# Patient Record
Sex: Female | Born: 2005 | Race: White | Hispanic: No | Marital: Single
Health system: Southern US, Community
[De-identification: ages and names within clinical notes are randomized; demographics above are authoritative.]

## PROBLEM LIST (undated history)

## (undated) DIAGNOSIS — J45909 Unspecified asthma, uncomplicated: Secondary | ICD-10-CM

## (undated) DIAGNOSIS — L309 Dermatitis, unspecified: Secondary | ICD-10-CM

## (undated) DIAGNOSIS — L509 Urticaria, unspecified: Secondary | ICD-10-CM

## (undated) HISTORY — DX: Dermatitis, unspecified: L30.9

## (undated) HISTORY — DX: Urticaria, unspecified: L50.9

## (undated) HISTORY — PX: TONSILLECTOMY: SUR1361

## (undated) HISTORY — PX: ADENOIDECTOMY: SUR15

## (undated) HISTORY — PX: TYMPANOSTOMY TUBE PLACEMENT: SHX32

## (undated) HISTORY — DX: Unspecified asthma, uncomplicated: J45.909

---
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
## 2023-03-07 NOTE — Progress Notes (Unsigned)
New Patient Note  RE: Sierra Briggs MRN: [id number] DOB: February 24, 2006 Date of Office Visit: 03/08/2023  Consult requested by: Chyrl Civatte, FNP Primary care provider: Rick Duff, PA-C  Chief Complaint: No chief complaint on file.  History of Present Illness: I had the pleasure of seeing Sierra Briggs for initial evaluation at the [facility] on 03/07/2023. She is a 17 y.o. female, who is referred here by Rick Duff, PA-C for the evaluation of asthma.  She reports symptoms of *** chest tightness, shortness of breath, coughing, wheezing, nocturnal awakenings for *** years. Current medications include *** which help. She reports *** using aerochamber with inhalers. She tried the following inhalers: ***. Main triggers are ***allergies, infections, weather changes, smoke, exercise, pet exposure. In the last month, frequency of symptoms: ***x/week. Frequency of nocturnal symptoms: ***x/month. Frequency of SABA use: ***x/week. Interference with physical activity: ***. Sleep is ***disturbed. In the last 12 months, emergency room visits/urgent care visits/doctor office visits or hospitalizations due to respiratory issues: ***. In the last 12 months, oral steroids courses: ***. Lifetime history of hospitalization for respiratory issues: ***. Prior intubations: ***. Asthma was diagnosed at age *** by ***. History of pneumonia: ***. She was evaluated by allergist ***pulmonologist in the past. Smoking exposure: ***. Up to date with flu vaccine: ***. Up to date with pneumonia vaccine: ***. Up to date with COVID-19 vaccine: ***. Prior Covid-19 infection: ***. History of reflux: ***.  Assessment and Plan: [name] is a 17 y.o. female with: ***  No follow-ups on file.  No orders of the defined types were placed in this encounter.  Lab Orders  No laboratory test(s) ordered today    Other allergy screening: Asthma: {Blank single:19197::"yes","no"} Rhino  conjunctivitis: {Blank single:19197::"yes","no"} Food allergy: {Blank single:19197::"yes","no"} Medication allergy: {Blank single:19197::"yes","no"} Hymenoptera allergy: {Blank single:19197::"yes","no"} Urticaria: {Blank single:19197::"yes","no"} Eczema:{Blank single:19197::"yes","no"} History of recurrent infections suggestive of immunodeficency: {Blank single:19197::"yes","no"}  Diagnostics: Spirometry:  Tracings reviewed. Her effort: {Blank single:19197::"Good reproducible efforts.","It was hard to get consistent efforts and there is a question as to whether this reflects a maximal maneuver.","Poor effort, data can not be interpreted."} FVC: ***L FEV1: ***L, ***% predicted FEV1/FVC ratio: ***% Interpretation: {Blank single:19197::"Spirometry consistent with mild obstructive disease","Spirometry consistent with moderate obstructive disease","Spirometry consistent with severe obstructive disease","Spirometry consistent with possible restrictive disease","Spirometry consistent with mixed obstructive and restrictive disease","Spirometry uninterpretable due to technique","Spirometry consistent with normal pattern","No overt abnormalities noted given today's efforts"}.  Please see scanned spirometry results for details.  Skin Testing: {Blank single:19197::"Select foods","Environmental allergy panel","Environmental allergy panel and select foods","Food allergy panel","None","Deferred due to recent antihistamines use"}. *** Results discussed with patient/family.   Past Medical History: Patient Active Problem List   Diagnosis Date Noted  . Bipolar 2 disorder (HCC) 10/02/2019  . Attention deficit hyperactivity disorder (ADHD), combined type 10/02/2019  . Post traumatic stress disorder (PTSD) 10/02/2019   Past Medical History:  Diagnosis Date  . Asthma    Past Surgical History: Past Surgical History:  Procedure Laterality Date  . TONSILLECTOMY     Medication List:  Current Outpatient  Medications  Medication Sig Dispense Refill  . albuterol (PROVENTIL) (2.5 MG/3ML) 0.083% nebulizer solution Take 3 mLs (2.5 mg total) by nebulization every 6 (six) hours as needed for wheezing. 75 mL 12  . busPIRone (BUSPAR) 15 MG tablet Take 1 tablet (15 mg total) by mouth 2 (two) times daily. (Patient not taking: Reported on 12/04/2020) 60 tablet 1  . cloNIDine (CATAPRES) 0.3 MG tablet TAKE  1 TABLET BY MOUTH AT BEDTIME 30 tablet 0  . escitalopram (LEXAPRO) 20 MG tablet Take 1 tablet by mouth once daily 30 tablet 0  . lisdexamfetamine (VYVANSE) 50 MG capsule Take 1 capsule (50 mg total) by mouth daily with breakfast. 30 capsule 0  . lisdexamfetamine (VYVANSE) 50 MG capsule Take 1 capsule (50 mg total) by mouth daily with breakfast. (Patient not taking: Reported on 12/04/2020) 30 capsule 0  . lurasidone (LATUDA) 20 MG TABS tablet Take 1 tablet with dinner 30 tablet 1  . montelukast (SINGULAIR) 5 MG chewable tablet Chew 5 mg by mouth at bedtime.     No current facility-administered medications for this visit.   Allergies: No Known Allergies Social History: Social History   Socioeconomic History  . Marital status: Single    Spouse name: Not on file  . Number of children: Not on file  . Years of education: Not on file  . Highest education level: Not on file  Occupational History  . Not on file  Tobacco Use  . Smoking status: Passive Smoke Exposure - Never Smoker  . Smokeless tobacco: Not on file  Substance and Sexual Activity  . Alcohol use: Not on file  . Drug use: Not on file  . Sexual activity: Not on file  Other Topics Concern  . Not on file  Social History Narrative  . Not on file   Social Determinants of Health   Financial Resource Strain: Not on file  Food Insecurity: Not on file  Transportation Needs: Not on file  Physical Activity: Not on file  Stress: Not on file  Social Connections: Not on file   Lives in a ***. Smoking: *** Occupation: ***  Environmental  HistorySurveyor, minerals in the house: Copywriter, advertising in the family room: {Blank single:19197::"yes","no"} Carpet in the bedroom: {Blank single:19197::"yes","no"} Heating: {Blank single:19197::"electric","gas","heat pump"} Cooling: {Blank single:19197::"central","window","heat pump"} Pet: {Blank single:19197::"yes ***","no"}  Family History: Family History  Problem Relation Age of Onset  . COPD Father   . Diabetes Father    Problem                               Relation Asthma                                   *** Eczema                                *** Food allergy                          *** Allergic rhino conjunctivitis     ***  Review of Systems  Constitutional:  Negative for appetite change, chills, fever and unexpected weight change.  HENT:  Negative for congestion and rhinorrhea.   Eyes:  Negative for itching.  Respiratory:  Negative for cough, chest tightness, shortness of breath and wheezing.   Cardiovascular:  Negative for chest pain.  Gastrointestinal:  Negative for abdominal pain.  Genitourinary:  Negative for difficulty urinating.  Skin:  Negative for rash.  Neurological:  Negative for headaches.   Objective: There were no vitals taken for this visit. There is no height or weight on file to calculate BMI. Physical Exam Vitals and nursing note reviewed.  Constitutional:  Appearance: Normal appearance. She is well-developed.  HENT:     Head: Normocephalic and atraumatic.     Right Ear: Tympanic membrane and external ear normal.     Left Ear: Tympanic membrane and external ear normal.     Nose: Nose normal.     Mouth/Throat:     Mouth: Mucous membranes are moist.     Pharynx: Oropharynx is clear.  Eyes:     Conjunctiva/sclera: Conjunctivae normal.  Cardiovascular:     Rate and Rhythm: Normal rate and regular rhythm.     Heart sounds: Normal heart sounds. No murmur heard.    No friction rub. No gallop.  Pulmonary:      Effort: Pulmonary effort is normal.     Breath sounds: Normal breath sounds. No wheezing, rhonchi or rales.  Musculoskeletal:     Cervical back: Neck supple.  Skin:    General: Skin is warm.     Findings: No rash.  Neurological:     Mental Status: She is alert and oriented to person, place, and time.  Psychiatric:        Behavior: Behavior normal.  The plan was reviewed with the patient/family, and all questions/concerned were addressed.  It was my pleasure to see [facility] today and participate in her care. Please feel free to contact me with any questions or concerns.  Sincerely,  Wyline Mood, DO Allergy & Immunology  [facility] office: [phone] [facility] office: [phone]

## 2023-03-08 ENCOUNTER — Ambulatory Visit (INDEPENDENT_AMBULATORY_CARE_PROVIDER_SITE_OTHER): Payer: Medicaid Other | Admitting: Allergy

## 2023-03-08 ENCOUNTER — Encounter: Payer: Self-pay | Admitting: Allergy

## 2023-03-08 VITALS — BP 110/70 | HR 94 | Temp 98.2°F | Resp 16 | Ht 65.0 in | Wt 209.0 lb

## 2023-03-08 DIAGNOSIS — Z889 Allergy status to unspecified drugs, medicaments and biological substances status: Secondary | ICD-10-CM

## 2023-03-08 DIAGNOSIS — J4520 Mild intermittent asthma, uncomplicated: Secondary | ICD-10-CM

## 2023-03-08 DIAGNOSIS — J3089 Other allergic rhinitis: Secondary | ICD-10-CM | POA: Diagnosis not present

## 2023-03-08 DIAGNOSIS — L2089 Other atopic dermatitis: Secondary | ICD-10-CM

## 2023-03-08 DIAGNOSIS — Z91038 Other insect allergy status: Secondary | ICD-10-CM

## 2023-03-08 DIAGNOSIS — K219 Gastro-esophageal reflux disease without esophagitis: Secondary | ICD-10-CM | POA: Diagnosis not present

## 2023-03-08 DIAGNOSIS — T781XXD Other adverse food reactions, not elsewhere classified, subsequent encounter: Secondary | ICD-10-CM

## 2023-03-08 MED ORDER — FLUTICASONE PROPIONATE 50 MCG/ACT NA SUSP: 1.0000 | Freq: Every day | NASAL | 5 refills | Status: AC | PRN

## 2023-03-08 MED ORDER — AEROCHAMBER MV MISC: 2 refills | Status: AC

## 2023-03-08 MED ORDER — ALBUTEROL SULFATE HFA 108 (90 BASE) MCG/ACT IN AERS: 2.0000 | INHALATION_SPRAY | RESPIRATORY_TRACT | 1 refills | Status: AC | PRN

## 2023-03-08 MED ORDER — TRIAMCINOLONE ACETONIDE 0.1 % EX OINT: 1.0000 | TOPICAL_OINTMENT | Freq: Two times a day (BID) | CUTANEOUS | 1 refills | Status: AC | PRN

## 2023-03-08 MED ORDER — BUDESONIDE-FORMOTEROL FUMARATE 80-4.5 MCG/ACT IN AERO: 2.0000 | INHALATION_SPRAY | Freq: Two times a day (BID) | RESPIRATORY_TRACT | 3 refills | Status: AC

## 2023-03-08 MED ORDER — EPINEPHRINE 0.3 MG/0.3ML IJ SOAJ: 0.3000 mg | INTRAMUSCULAR | 1 refills | Status: AC | PRN

## 2023-03-08 MED ORDER — LEVOCETIRIZINE DIHYDROCHLORIDE 5 MG PO TABS: 5.0000 mg | ORAL_TABLET | Freq: Every evening | ORAL | 5 refills | Status: AC

## 2023-03-08 NOTE — Patient Instructions (Addendum)
Requesting records from prior allergist.  Asthma Normal breathing test today. Spacer prescribed and demonstrated proper use with inhaler. Patient understood technique and all questions/concerned were addressed.  During respiratory infections/flares:  Start Symbicort 2 puffs twice a day with spacer and rinse mouth afterwards for 1-2 weeks until your breathing symptoms return to baseline.  Pretreat with albuterol 2 puffs or albuterol nebulizer.  If you need to use your albuterol nebulizer machine back to back within 15-30 minutes with no relief then please go to the ER/urgent care for further evaluation.  May use albuterol rescue inhaler 2 puffs or nebulizer every 4 to 6 hours as needed for shortness of breath, chest tightness, coughing, and wheezing. May use albuterol rescue inhaler 2 puffs 5 to 15 minutes prior to strenuous physical activities. Monitor frequency of use - if you need to use it more than twice per week on a consistent basis let us know.  Breathing control goals:  Full participation in all desired activities (may need albuterol before activity) Albuterol use two times or less a week on average (not counting use with activity) Cough interfering with sleep two times or less a month Oral steroids no more than once a year No hospitalizations   Environmental allergies Start environmental control measures as below. Take Xyzal (levocetirizine) daily as needed. May take twice a day during allergy flares.  Use Flonase (fluticasone) nasal spray 1-2 sprays per nostril once a day as needed for nasal congestion.  Nasal saline spray (i.e., Simply Saline) or nasal saline lavage (i.e., NeilMed) is recommended as needed and prior to medicated nasal sprays.  Eczema  See below for proper skin care. Use fragrance free and dye free products. No dryer sheets or fabric softener.   Use triamcinolone 0.1% ointment twice a day as needed for rash flares. Do not use on the face, neck, armpits or  groin area. Do not use more than 3 weeks in a row.   Bee sting allergy Continue to avoid. I have prescribed epinephrine injectable device and demonstrated proper use. For mild symptoms you can take over the counter antihistamines such as Benadryl 1-2 tablets = 25-50mg  and monitor symptoms closely. If symptoms worsen or if you have severe symptoms including breathing issues, throat closure, significant swelling, whole body hives, severe diarrhea and vomiting, lightheadedness then inject epinephrine and seek immediate medical care afterwards. Emergency action plan given.  Food allergy Continue to avoid peanuts, tree nuts and apples. Action plan given.  Heartburn See handout for lifestyle and dietary modifications.  Drug allergies Continue to avoid sulfa and penicillin.   Return in about 2 months (around 05/08/2023). Or sooner if needed.   Reducing Pollen Exposure Pollen seasons: trees (spring), grass (summer) and ragweed/weeds (fall). Keep windows closed in your home and car to lower pollen exposure.  Install air conditioning in the bedroom and throughout the house if possible.  Avoid going out in dry windy days - especially early morning. Pollen counts are highest between 5 - 10 AM and on dry, hot and windy days.  Save outside activities for late afternoon or after a heavy rain, when pollen levels are lower.  Avoid mowing of grass if you have grass pollen allergy. Be aware that pollen can also be transported indoors on people and pets.  Dry your clothes in an automatic dryer rather than hanging them outside where they might collect pollen.  Rinse hair and eyes before bedtime.  Skin care recommendations  Bath time: Always use lukewarm water. AVOID very  hot or cold water. Keep bathing time to 5-10 minutes. Do NOT use bubble bath. Use a mild soap and use just enough to wash the dirty areas. Do NOT scrub skin vigorously.  After bathing, pat dry your skin with a towel. Do NOT rub or  scrub the skin.  Moisturizers and prescriptions:  ALWAYS apply moisturizers immediately after bathing (within 3 minutes). This helps to lock-in moisture. Use the moisturizer several times a day over the whole body. Good summer moisturizers include: Aveeno, CeraVe, Cetaphil. Good winter moisturizers include: Aquaphor, Vaseline, Cerave, Cetaphil, Eucerin, Vanicream. When using moisturizers along with medications, the moisturizer should be applied about one hour after applying the medication to prevent diluting effect of the medication or moisturize around where you applied the medications. When not using medications, the moisturizer can be continued twice daily as maintenance.  Laundry and clothing: Avoid laundry products with added color or perfumes. Use unscented hypo-allergenic laundry products such as Tide free, Cheer free & gentle, and All free and clear.  If the skin still seems dry or sensitive, you can try double-rinsing the clothes. Avoid tight or scratchy clothing such as wool. Do not use fabric softeners or dyer sheets.

## 2023-03-29 ENCOUNTER — Encounter: Payer: Self-pay | Admitting: Allergy

## 2023-03-29 NOTE — Progress Notes (Signed)
Reviewed notes from [facility]. Date of service: multiple. See scanned notes for full documentation. 2019 bloodwork positive to dust mites, cat, dog, grass, trees, weed, ragweed, mouse.  Borderline to cockroach, mold,  Normal IgG. Poor pneumococcal titers. Tetanus titer positive.
